# Patient Record
Sex: Female | Born: 2000 | Race: Black or African American | Hispanic: No | Marital: Single | Smoking: Never smoker
Health system: Southern US, Community
[De-identification: ages and names within clinical notes are randomized; demographics above are authoritative.]

## PROBLEM LIST (undated history)

## (undated) DIAGNOSIS — F419 Anxiety disorder, unspecified: Secondary | ICD-10-CM

## (undated) DIAGNOSIS — T7840XA Allergy, unspecified, initial encounter: Secondary | ICD-10-CM

## (undated) DIAGNOSIS — E119 Type 2 diabetes mellitus without complications: Secondary | ICD-10-CM

## (undated) HISTORY — DX: Allergy, unspecified, initial encounter: T78.40XA

## (undated) HISTORY — DX: Type 2 diabetes mellitus without complications: E11.9

## (undated) HISTORY — DX: Anxiety disorder, unspecified: F41.9

---
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
## 2022-05-05 ENCOUNTER — Encounter: Payer: Self-pay | Admitting: Family

## 2022-05-14 ENCOUNTER — Telehealth: Admitting: Family

## 2022-05-18 ENCOUNTER — Ambulatory Visit (INDEPENDENT_AMBULATORY_CARE_PROVIDER_SITE_OTHER): Admitting: Family

## 2022-05-18 ENCOUNTER — Encounter: Payer: Self-pay | Admitting: Family

## 2022-05-18 VITALS — BP 128/87 | HR 100 | Ht 67.32 in | Wt 278.8 lb

## 2022-05-18 DIAGNOSIS — E282 Polycystic ovarian syndrome: Secondary | ICD-10-CM

## 2022-05-18 DIAGNOSIS — Z3041 Encounter for surveillance of contraceptive pills: Secondary | ICD-10-CM

## 2022-05-18 DIAGNOSIS — L83 Acanthosis nigricans: Secondary | ICD-10-CM | POA: Diagnosis not present

## 2022-05-18 DIAGNOSIS — N926 Irregular menstruation, unspecified: Secondary | ICD-10-CM | POA: Diagnosis not present

## 2022-05-18 NOTE — Progress Notes (Addendum)
History was provided by the patient and mother.  Catherine Richmond is a 21 y.o. female who is here for PCOS.   PCP confirmed? Yes.    Bernadette Hoit, MD  HPI:  -no bleeding or period  -no cramping  -losing weight: working out, walking, eating twice daily  -never dizzy or light-headed  -water intake: about 60 oz per day  -no headaches, no vision changes - Rx current  -taking Metformin with no GI issues  -no acne, no hirsutism     Active Problem List:  Allergic rhinitis Acanthosis  Acne  Dysmenorrhea Morbid Obesity  Oligomenorrhea  Vitamin D Deficiency  Chronic allergic conjunctivitis Food Allergy   Current Outpatient Medications on File Prior to Visit  Medication Sig Dispense Refill   albuterol (VENTOLIN HFA) 108 (90 Base) MCG/ACT inhaler      azelastine (OPTIVAR) 0.05 % ophthalmic solution      chlorhexidine (PERIDEX) 0.12 % solution      Cholecalciferol (VITAMIN D) 2000 units tablet Take 2,000 Units by mouth daily.     clindamycin (CLINDAGEL) 1 % gel Apply topically 2 (two) times daily. To affected areas 30 g 3   drospirenone-ethinyl estradiol (YAZ) 3-0.02 MG tablet      EPINEPHrine 0.3 mg/0.3 mL IJ SOAJ injection USE AS DIRECTED AS NEEDED SYSTEMIC REACTIONS INJECTION 30 DAYS     fluticasone (FLONASE) 50 MCG/ACT nasal spray      levocetirizine (XYZAL) 5 MG tablet   3   metFORMIN (GLUCOPHAGE-XR) 750 MG 24 hr tablet Take 1 tablet (750 mg total) by mouth daily with breakfast. 90 tablet 3   montelukast (SINGULAIR) 10 MG tablet   3   Multiple Vitamin (MULTIVITAMIN) tablet Take 1 tablet by mouth daily.     norethindrone (AYGESTIN) 5 MG tablet Take 2 tablets (10 mg total) by mouth daily. 10 tablet 0   norgestrel-ethinyl estradiol (LO/OVRAL) 0.3-30 MG-MCG tablet Take 1 pill by mouth daily. (Patient not taking: Reported on 05/18/2022) 84 tablet 3   No current facility-administered medications on file prior to visit.    Allergies  Allergen Reactions   Dust Mite Extract     Gramineae Pollens    Other     Tree nuts. Pt reports her throat felt like it was closing after ingesting tree nuts on 3-4 different occasions.    Tree Extract     Physical Exam:    Vitals:   05/18/22 1138 05/18/22 1142  BP: 137/82 128/87  Pulse: (!) 107 100  Weight: 278 lb 12.8 oz (126.5 kg)   Height: 5' 7.32" (1.71 m)    Wt Readings from Last 3 Encounters:  05/18/22 278 lb 12.8 oz (126.5 kg) (>99 %, Z= 2.68)*  11/05/21 264 lb 9.6 oz (120 kg) (>99 %, Z= 2.56)*  06/25/21 273 lb 3.2 oz (123.9 kg) (>99 %, Z= 2.59)*   * Growth percentiles are based on CDC (Girls, 2-20 Years) data.     Blood pressure %iles are not available for patients who are 18 years or older. No LMP recorded.  Physical Exam Constitutional:      General: She is not in acute distress.    Appearance: She is well-developed.  HENT:     Head: Normocephalic and atraumatic.  Eyes:     General: No scleral icterus.    Pupils: Pupils are equal, round, and reactive to light.  Neck:     Thyroid: No thyromegaly.  Cardiovascular:     Rate and Rhythm: Normal rate and regular rhythm.  Heart sounds: Normal heart sounds. No murmur heard. Pulmonary:     Effort: Pulmonary effort is normal.     Breath sounds: Normal breath sounds.  Abdominal:     Palpations: Abdomen is soft.  Musculoskeletal:        General: Normal range of motion.     Cervical back: Normal range of motion and neck supple.  Lymphadenopathy:     Cervical: No cervical adenopathy.  Skin:    General: Skin is warm and dry.     Findings: No rash.  Neurological:     Mental Status: She is alert and oriented to person, place, and time.     Cranial Nerves: No cranial nerve deficit.  Psychiatric:        Behavior: Behavior normal.        Thought Content: Thought content normal.        Judgment: Judgment normal.    Assessment/Plan:  Irregular periods Acanthosis  Encounter for birth control pills maintenance History of insulin resistance and irregular  periods:  [name] had an elevated A1C of 5.8 in June 2015 and was seen by Pediatric Endocrinology at that time. Through dietary changes and lifestyle changes, by next follow-up in March 2016, her A1C had returned to normal range. [name] was referred to Adolescent Medicine in 2018 for irregular cycles, with acne well-managed by Epiduo topical gel. At that time, she was started on birth control pills to help regulate her cycles. Her blood work showed a normal testosterone level and an expected LH/FSH ratio, as well as a normal DHEAS. She continues on birth control pills to manage her cycle, has maintained clear skin through her acne medication regimen, and her A1C is within normal range as she continues to manage this through exercise, diet, and Metformin 750 mg daily.    PCOS is a metabolic condition with symptoms of menstrual irregularity, excess hair growth, acne and sometimes obesity. Treatment is symptom management, most importantly regulation of hormones through hormonal contraceptive options.Zenith's lab work did not support the diagnosis of PCOS, however we are managing symptoms similar to those seen in PCOS.  [name] is currently managing symptoms through continuous cycling birth control pills, and clindamycin gel for acne, which to date has completely cleared. She is not experiencing hirsutism, or excessive hair growth.    Monitoring labs today:  - CBC with Differential/Platelet - Comprehensive metabolic panel - Hemoglobin A1c - VITAMIN D 25 Hydroxy (Vit-D Deficiency, Fractures) - Lipid panel

## 2022-05-19 LAB — COMPREHENSIVE METABOLIC PANEL
AG Ratio: 1.1 (calc) (ref 1.0–2.5)
ALT: 7 U/L (ref 5–32)
AST: 13 U/L (ref 12–32)
Albumin: 3.6 g/dL (ref 3.6–5.1)
Alkaline phosphatase (APISO): 80 U/L (ref 36–128)
BUN: 9 mg/dL (ref 7–20)
CO2: 22 mmol/L (ref 20–32)
Calcium: 9 mg/dL (ref 8.9–10.4)
Chloride: 105 mmol/L (ref 98–110)
Creat: 0.92 mg/dL (ref 0.50–0.96)
Globulin: 3.2 g/dL (calc) (ref 2.0–3.8)
Glucose, Bld: 109 mg/dL (ref 65–139)
Potassium: 4 mmol/L (ref 3.8–5.1)
Sodium: 137 mmol/L (ref 135–146)
Total Bilirubin: 0.5 mg/dL (ref 0.2–1.1)
Total Protein: 6.8 g/dL (ref 6.3–8.2)

## 2022-05-19 LAB — LIPID PANEL
Cholesterol: 127 mg/dL (ref <170)
HDL: 53 mg/dL (ref >45)
LDL Cholesterol (Calc): 61 mg/dL (calc) (ref <110)
Non-HDL Cholesterol (Calc): 74 mg/dL (calc) (ref <120)
Total CHOL/HDL Ratio: 2.4 (calc) (ref <5.0)
Triglycerides: 58 mg/dL (ref <90)

## 2022-05-19 LAB — CBC WITH DIFFERENTIAL/PLATELET
Absolute Monocytes: 387 cells/uL (ref 200–950)
Basophils Absolute: 44 cells/uL (ref 0–200)
Basophils Relative: 0.5 %
Eosinophils Absolute: 18 cells/uL (ref 15–500)
Eosinophils Relative: 0.2 %
HCT: 39.2 % (ref 35.0–45.0)
Hemoglobin: 12.4 g/dL (ref 11.7–15.5)
Lymphs Abs: 1822 cells/uL (ref 850–3900)
MCH: 24.9 pg — ABNORMAL LOW (ref 27.0–33.0)
MCHC: 31.6 g/dL — ABNORMAL LOW (ref 32.0–36.0)
MCV: 78.7 fL — ABNORMAL LOW (ref 80.0–100.0)
MPV: 12 fL (ref 7.5–12.5)
Monocytes Relative: 4.4 %
Neutro Abs: 6530 cells/uL (ref 1500–7800)
Neutrophils Relative %: 74.2 %
Platelets: 396 10*3/uL (ref 140–400)
RBC: 4.98 10*6/uL (ref 3.80–5.10)
RDW: 14.5 % (ref 11.0–15.0)
Total Lymphocyte: 20.7 %
WBC: 8.8 10*3/uL (ref 3.8–10.8)

## 2022-05-19 LAB — HEMOGLOBIN A1C
Hgb A1c MFr Bld: 5.5 % of total Hgb (ref <5.7)
Mean Plasma Glucose: 111 mg/dL
eAG (mmol/L): 6.2 mmol/L

## 2022-05-19 LAB — VITAMIN D 25 HYDROXY (VIT D DEFICIENCY, FRACTURES): Vit D, 25-Hydroxy: 49 ng/mL (ref 30–100)

## 2022-05-21 ENCOUNTER — Encounter: Payer: Self-pay | Admitting: Family

## 2022-05-24 ENCOUNTER — Encounter: Payer: Self-pay | Admitting: Family

## 2022-05-24 MED ORDER — NORGESTREL-ETHINYL ESTRADIOL 0.3-30 MG-MCG PO TABS: ORAL_TABLET | ORAL | 3 refills | Status: DC

## 2022-05-25 ENCOUNTER — Encounter: Payer: Self-pay | Admitting: Family

## 2022-06-03 ENCOUNTER — Other Ambulatory Visit: Payer: Self-pay | Admitting: Pediatrics

## 2022-06-03 ENCOUNTER — Encounter: Payer: Self-pay | Admitting: Family

## 2022-06-03 DIAGNOSIS — N914 Secondary oligomenorrhea: Secondary | ICD-10-CM

## 2022-06-08 ENCOUNTER — Encounter: Payer: Self-pay | Admitting: Family

## 2022-06-14 ENCOUNTER — Other Ambulatory Visit: Payer: Self-pay | Admitting: Family

## 2022-06-15 ENCOUNTER — Encounter: Payer: Self-pay | Admitting: Family

## 2022-06-21 ENCOUNTER — Encounter (HOSPITAL_COMMUNITY): Payer: Self-pay

## 2022-06-21 ENCOUNTER — Emergency Department (HOSPITAL_COMMUNITY)
Admission: EM | Admit: 2022-06-21 | Discharge: 2022-06-21 | Disposition: A | Discharge status: Home / Self Care | Attending: Emergency Medicine | Admitting: Emergency Medicine

## 2022-06-21 ENCOUNTER — Other Ambulatory Visit: Payer: Self-pay

## 2022-06-21 DIAGNOSIS — R197 Diarrhea, unspecified: Secondary | ICD-10-CM | POA: Diagnosis not present

## 2022-06-21 DIAGNOSIS — E119 Type 2 diabetes mellitus without complications: Secondary | ICD-10-CM | POA: Insufficient documentation

## 2022-06-21 DIAGNOSIS — R1013 Epigastric pain: Secondary | ICD-10-CM | POA: Insufficient documentation

## 2022-06-21 LAB — CBC WITH DIFFERENTIAL/PLATELET
Abs Immature Granulocytes: 0.04 10*3/uL (ref 0.00–0.07)
Basophils Absolute: 0 10*3/uL (ref 0.0–0.1)
Basophils Relative: 0 %
Eosinophils Absolute: 0.1 10*3/uL (ref 0.0–0.5)
Eosinophils Relative: 1 %
HCT: 38.3 % (ref 36.0–46.0)
Hemoglobin: 12.1 g/dL (ref 12.0–15.0)
Immature Granulocytes: 0 %
Lymphocytes Relative: 22 %
Lymphs Abs: 2.7 10*3/uL (ref 0.7–4.0)
MCH: 25.1 pg — ABNORMAL LOW (ref 26.0–34.0)
MCHC: 31.6 g/dL (ref 30.0–36.0)
MCV: 79.3 fL — ABNORMAL LOW (ref 80.0–100.0)
Monocytes Absolute: 0.8 10*3/uL (ref 0.1–1.0)
Monocytes Relative: 6 %
Neutro Abs: 8.6 10*3/uL — ABNORMAL HIGH (ref 1.7–7.7)
Neutrophils Relative %: 71 %
Platelets: 402 10*3/uL — ABNORMAL HIGH (ref 150–400)
RBC: 4.83 MIL/uL (ref 3.87–5.11)
RDW: 14.4 % (ref 11.5–15.5)
WBC: 12.2 10*3/uL — ABNORMAL HIGH (ref 4.0–10.5)
nRBC: 0 % (ref 0.0–0.2)

## 2022-06-21 LAB — COMPREHENSIVE METABOLIC PANEL
ALT: 12 U/L (ref 0–44)
AST: 17 U/L (ref 15–41)
Albumin: 3.4 g/dL — ABNORMAL LOW (ref 3.5–5.0)
Alkaline Phosphatase: 66 U/L (ref 38–126)
Anion gap: 6 (ref 5–15)
BUN: 11 mg/dL (ref 6–20)
CO2: 24 mmol/L (ref 22–32)
Calcium: 8.7 mg/dL — ABNORMAL LOW (ref 8.9–10.3)
Chloride: 107 mmol/L (ref 98–111)
Creatinine, Ser: 0.86 mg/dL (ref 0.44–1.00)
GFR, Estimated: >60 mL/min (ref >60)
Glucose, Bld: 82 mg/dL (ref 70–99)
Potassium: 3.7 mmol/L (ref 3.5–5.1)
Sodium: 137 mmol/L (ref 135–145)
Total Bilirubin: 0.4 mg/dL (ref 0.3–1.2)
Total Protein: 7.4 g/dL (ref 6.5–8.1)

## 2022-06-21 LAB — LIPASE, BLOOD: Lipase: 31 U/L (ref 11–51)

## 2022-06-21 MED ORDER — ACETAMINOPHEN 325 MG PO TABS
650.0000 mg | ORAL_TABLET | Freq: Once | ORAL | Status: AC
  Administered 2022-06-21: 650 mg via ORAL
  Filled 2022-06-21: qty 2

## 2022-06-21 MED ORDER — SUCRALFATE 1 G PO TABS: 1.0000 g | ORAL_TABLET | Freq: Four times a day (QID) | ORAL | 0 refills | Status: AC | PRN

## 2022-06-21 MED ORDER — OMEPRAZOLE 20 MG PO CPDR: 20.0000 mg | DELAYED_RELEASE_CAPSULE | Freq: Every day | ORAL | 1 refills | Status: AC

## 2022-06-21 MED ORDER — ALUM & MAG HYDROXIDE-SIMETH 200-200-20 MG/5ML PO SUSP
30.0000 mL | Freq: Once | ORAL | Status: AC
  Administered 2022-06-21: 30 mL via ORAL
  Filled 2022-06-21: qty 30

## 2022-06-21 MED ORDER — SUCRALFATE 1 G PO TABS: 1.0000 g | ORAL_TABLET | Freq: Four times a day (QID) | ORAL | 0 refills | Status: DC | PRN

## 2022-06-21 MED ORDER — OMEPRAZOLE 20 MG PO CPDR: 20.0000 mg | DELAYED_RELEASE_CAPSULE | Freq: Every day | ORAL | 1 refills | Status: DC

## 2022-06-21 NOTE — Discharge Instructions (Addendum)
You were evaluated in the Emergency Department and after careful evaluation, we did not find any emergent condition requiring admission or further testing in the hospital.  Your exam/testing today is overall reassuring.  Symptoms likely due to gastritis or inflammation of the lining of the stomach.  This can be due to a virus or due to gastroesophageal reflux disease.  Take the omeprazole and Carafate as we discussed.  Please return to the Emergency Department if you experience any worsening of your condition.   Thank you for allowing [place] to be a part of your care.

## 2022-06-21 NOTE — ED Provider Notes (Signed)
[facility]-EMERGENCY DEPT [facility] Emergency Department Provider Note MRN:  [id number]  Arrival date & time: 06/21/22     Chief Complaint   Abdominal Pain   History of Present Illness   Catherine Richmond is a 21 y.o. year-old female with no prior baseline history presenting to the ED with chief complaint of abdominal pain.  Epigastric abdominal pain for the past week.  Usually only occurring at night when lying flat.  Today has been present all day.  Described as burning/tightness.  No shortness of breath, no leg pain or swelling, no chest pain.  No fever.  Episode of diarrhea earlier this week.  Review of Systems  A thorough review of systems was obtained and all systems are negative except as noted in the HPI and PMH.   Patient's Health History    Past Medical History:  Diagnosis Date   Allergy    Phreesia 12/22/2020   Anxiety    Phreesia 12/22/2020   Constipation    Diabetes mellitus without complication (HCC)    Phreesia 12/22/2020    History reviewed. No pertinent surgical history.  Family History  Problem Relation Age of Onset   Hypertension Mother    Hypertension Father    Hyperlipidemia Father    Hyperlipidemia Maternal Grandmother    Hypertension Maternal Grandmother    Hyperlipidemia Maternal Grandfather    Hypertension Maternal Grandfather    Diabetes Maternal Grandfather     Social History   Socioeconomic History   Marital status: Single    Spouse name: Not on file   Number of children: Not on file   Years of education: Not on file   Highest education level: Not on file  Occupational History   Not on file  Tobacco Use   Smoking status: Never   Smokeless tobacco: Never  Substance and Sexual Activity   Alcohol use: No    Alcohol/week: 0.0 standard drinks of alcohol   Drug use: No   Sexual activity: Not on file  Other Topics Concern   Not on file  Social History Narrative   Is in 6th grade at [employer]   Lives with parents and siblings    Social Determinants of Health   Financial Resource Strain: Not on file  Food Insecurity: Not on file  Transportation Needs: Not on file  Physical Activity: Not on file  Stress: Not on file  Social Connections: Not on file  Intimate Partner Violence: Not on file     Physical Exam   Vitals:   06/21/22 2148 06/21/22 2316  BP: 130/75 127/83  Pulse: 78 88  Resp: 17 18  Temp:  98.7 F (37.1 C)  SpO2: 100% 100%    CONSTITUTIONAL: Well-appearing, NAD NEURO/PSYCH:  Alert and oriented x 3, no focal deficits EYES:  eyes equal and reactive ENT/NECK:  no LAD, no JVD CARDIO: Regular rate, well-perfused, normal S1 and S2 PULM:  CTAB no wheezing or rhonchi GI/GU:  non-distended, non-tender MSK/SPINE:  No gross deformities, no edema SKIN:  no rash, atraumatic   *Additional and/or pertinent findings included in MDM below  Diagnostic and Interventional Summary    EKG Interpretation  Date/Time:    Ventricular Rate:    PR Interval:    QRS Duration:   QT Interval:    QTC Calculation:   R Axis:     Text Interpretation:         Labs Reviewed  COMPREHENSIVE METABOLIC PANEL - Abnormal; Notable for the following components:  Result Value   Calcium 8.7 (*)    Albumin 3.4 (*)    All other components within normal limits  CBC WITH DIFFERENTIAL/PLATELET - Abnormal; Notable for the following components:   WBC 12.2 (*)    MCV 79.3 (*)    MCH 25.1 (*)    Platelets 402 (*)    Neutro Abs 8.6 (*)    All other components within normal limits  LIPASE, BLOOD    No orders to display    Medications  acetaminophen (TYLENOL) tablet 650 mg (650 mg Oral Given 06/21/22 1827)  alum & mag hydroxide-simeth (MAALOX/MYLANTA) 200-200-20 MG/5ML suspension 30 mL (30 mLs Oral Given 06/21/22 2343)     Procedures  /  Critical Care Procedures  ED Course and Medical Decision Making  Initial Impression and Ddx History suspicious for GERD.  Gallstones, biliary colic felt to be much less likely,  no right upper quadrant abdominal tenderness.  Screening labs show normal LFTs, bilirubin, normal lipase, normal blood counts and electrolytes.  Nothing to suggest emergent process, appropriate for trial of PPI  Past medical/surgical history that increases complexity of ED encounter: None  Interpretation of Diagnostics I personally reviewed the laboratory assessment and my interpretation is as follows: See above    Patient Reassessment and Ultimate Disposition/Management     Discharge  Patient management required discussion with the following services or consulting groups:  None  Complexity of Problems Addressed Acute illness or injury that poses threat of life of bodily function  Additional Data Reviewed and Analyzed Further history obtained from: Further history from spouse/family member  Additional Factors Impacting ED Encounter Risk Prescriptions  Elmer Sow. Pilar Plate, MD [facility] Emergency Medicine [facility] [email or web address]  Final Clinical Impressions(s) / ED Diagnoses     ICD-10-CM   1. Epigastric pain  R10.13       ED Discharge Orders          Ordered    omeprazole (PRILOSEC) 20 MG capsule  Daily        06/21/22 2344    sucralfate (CARAFATE) 1 g tablet  4 times daily PRN        06/21/22 2344             Discharge Instructions Discussed with and Provided to Patient:    Discharge Instructions      You were evaluated in the Emergency Department and after careful evaluation, we did not find any emergent condition requiring admission or further testing in the hospital.  Your exam/testing today is overall reassuring.  Symptoms likely due to gastritis or inflammation of the lining of the stomach.  This can be due to a virus or due to gastroesophageal reflux disease.  Take the omeprazole and Carafate as we discussed.  Please return to the Emergency Department if you experience any worsening of your condition.   Thank you for allowing [place]  to be a part of your care.      Sabas Sous, MD 06/21/22 [phone]

## 2022-06-21 NOTE — ED Triage Notes (Signed)
Pt reports lower mid abdominal pain for a week and a half. Pt denies N/V and endorses one episode of diarrhea. Denies fever, back pain, abnormal vaginal discharge, and vaginal bleeding. LMP in April, pt reports she takes birth control.

## 2022-06-21 NOTE — ED Provider Triage Note (Signed)
Emergency Medicine Provider Triage Evaluation Note  Catherine Richmond , a 21 y.o. female  was evaluated in triage.  Pt complains of abdominal pain in a bandlike pattern across the epigastrium x1 week.  Patient denies nausea, vomiting, diarrhea.  She does note that she is on OCP and has not had a period since April.  She denies dysuria, hematuria, vaginal discharge or discomfort..  Review of Systems  Positive:  Negative:   Physical Exam  BP 134/86 (BP Location: Left Arm)   Pulse (!) 105   Temp 98.6 F (37 C) (Oral)   Resp 18   SpO2 100%  Gen:   Awake, no distress   Resp:  Normal effort  MSK:   Moves extremities without difficulty  Other:  Abdomen is soft, nondistended and not obviously tender to palpation.  When pressing across the upper abdomen, she just notes that it is "uncomfortable"  Medical Decision Making  Medically screening exam initiated at 5:35 PM.  Appropriate orders placed.  Salome Holmes was informed that the remainder of the evaluation will be completed by another provider, this initial triage assessment does not replace that evaluation, and the importance of remaining in the ED until their evaluation is complete.     Janell Quiet, [place] 06/21/22 1736

## 2022-06-22 ENCOUNTER — Encounter: Payer: Self-pay | Admitting: Family

## 2022-07-02 ENCOUNTER — Encounter: Payer: Self-pay | Admitting: Family

## 2022-07-12 ENCOUNTER — Encounter: Payer: Self-pay | Admitting: Family

## 2022-07-14 ENCOUNTER — Encounter: Payer: Self-pay | Admitting: Family

## 2022-07-26 ENCOUNTER — Ambulatory Visit: Admitting: Family

## 2022-08-06 ENCOUNTER — Telehealth: Payer: Self-pay | Admitting: Family

## 2022-08-06 ENCOUNTER — Encounter: Payer: Self-pay | Admitting: Family

## 2022-08-06 ENCOUNTER — Ambulatory Visit (INDEPENDENT_AMBULATORY_CARE_PROVIDER_SITE_OTHER): Admitting: Family

## 2022-08-06 VITALS — BP 139/86 | HR 83 | Ht 67.0 in | Wt 287.8 lb

## 2022-08-06 DIAGNOSIS — N9489 Other specified conditions associated with female genital organs and menstrual cycle: Secondary | ICD-10-CM

## 2022-08-06 NOTE — Telephone Encounter (Signed)
Erroneous encounter

## 2022-08-06 NOTE — Progress Notes (Signed)
History was provided by the patient and mother.  Catherine Richmond is a 21 y.o. female who is here for physical, paperwork for ROTC.   PCP confirmed? Needs adult PCP   HPI:   Here for from from [facility] for clearance, needs body metrics form filled out LMP 8/23-9/1, otherwise using pills for menstrual suppresion  Had COVID 08/03 without any residual symptoms or side effects She has never had an EKG/WCHO for baseline No headaches, no vision changes No chest pain, SOB, no pain or concerns with exertion  GERD symptoms only with certain foods  No nausea, vomiting, diarrhea or constipation  No dysuria Mild dry skin  on L chest/shoulder; no itching, no rash No Muscle pain or joint pain    Patient Active Problem List   Diagnosis Date Noted   Allergic rhinitis 07/31/2021   Allergic rhinitis due to animal (cat) (dog) hair and dander 07/31/2021   Allergic rhinitis due to pollen 07/31/2021   Chronic allergic conjunctivitis 07/31/2021   Food allergy 07/31/2021   Diagnosis deferred 11/05/2019   Dysmenorrhea 04/18/2019   Vitamin D deficiency 03/05/2019   Oligomenorrhea 03/11/2016   Acne 03/11/2016   Morbid obesity (HCC) 02/24/2015   Acanthosis 10/17/2014    Current Outpatient Medications on File Prior to Visit  Medication Sig Dispense Refill   azelastine (OPTIVAR) 0.05 % ophthalmic solution      chlorhexidine (PERIDEX) 0.12 % solution      Cholecalciferol (VITAMIN D) 2000 units tablet Take 2,000 Units by mouth daily.     EPINEPHrine 0.3 mg/0.3 mL IJ SOAJ injection USE AS DIRECTED AS NEEDED SYSTEMIC REACTIONS INJECTION 30 DAYS     fluticasone (FLONASE) 50 MCG/ACT nasal spray      metFORMIN (GLUCOPHAGE-XR) 750 MG 24 hr tablet Take 1 tablet (750 mg total) by mouth daily with breakfast. 90 tablet 3   montelukast (SINGULAIR) 10 MG tablet   3   Multiple Vitamin (MULTIVITAMIN) tablet Take 1 tablet by mouth daily.     norgestrel-ethinyl estradiol (CRYSELLE-28) 0.3-30 MG-MCG tablet TAKE 1 TABLET  DAILY, CONTINUOUS CYCLING 112 tablet 3   omeprazole (PRILOSEC) 20 MG capsule Take 1 capsule (20 mg total) by mouth daily. 30 capsule 1   sucralfate (CARAFATE) 1 g tablet Take 1 tablet (1 g total) by mouth 4 (four) times daily as needed. 30 tablet 0   No current facility-administered medications on file prior to visit.    Allergies  Allergen Reactions   Dust Mite Extract    Gramineae Pollens    Other     Tree nuts. Pt reports her throat felt like it was closing after ingesting tree nuts on 3-4 different occasions.    Tree Extract     Physical Exam:    Vitals:   08/06/22 1035 08/06/22 1036  BP: 136/83 139/86  Pulse: 88 83  Weight: 287 lb 12.8 oz (130.5 kg)   Height: 5\' 7"  (1.702 m)    Wt Readings from Last 3 Encounters:  08/06/22 287 lb 12.8 oz (130.5 kg) (>99 %, Z= 2.75)*  05/18/22 278 lb 12.8 oz (126.5 kg) (>99 %, Z= 2.68)*  11/05/21 264 lb 9.6 oz (120 kg) (>99 %, Z= 2.56)*   * Growth percentiles are based on CDC (Girls, 2-20 Years) data.     Blood pressure %iles are not available for patients who are 18 years or older. No LMP recorded.  Physical Exam Constitutional:      General: She is not in acute distress.    Appearance: She  is well-developed.  HENT:     Head: Normocephalic and atraumatic.  Eyes:     General: No scleral icterus.    Pupils: Pupils are equal, round, and reactive to light.  Neck:     Thyroid: No thyromegaly.  Cardiovascular:     Rate and Rhythm: Normal rate and regular rhythm.     Heart sounds: Normal heart sounds. No murmur heard. Pulmonary:     Effort: Pulmonary effort is normal.     Breath sounds: Normal breath sounds.  Musculoskeletal:        General: No swelling. Normal range of motion.     Cervical back: Normal range of motion and neck supple.  Lymphadenopathy:     Cervical: No cervical adenopathy.  Skin:    General: Skin is warm and dry.     Capillary Refill: Capillary refill takes less than 2 seconds.     Findings: No rash.   Neurological:     General: No focal deficit present.     Mental Status: She is alert and oriented to person, place, and time.     Cranial Nerves: No cranial nerve deficit.     Motor: No weakness or tremor.     Gait: Gait normal.  Psychiatric:        Attention and Perception: Attention normal.        Mood and Affect: Mood normal.        Speech: Speech normal.        Behavior: Behavior normal.        Thought Content: Thought content normal.        Judgment: Judgment normal.     Assessment/Plan: 1. Menstrual suppression 2. Morbid obesity (HCC)  We reviewed ROTC form together; filled out and given to patient.  Explained in form that she has not had any physical complications with physical activity or exertion. Her A1C has been in normal range for over 4 years we have monitored.  Initially in 2017, she had an A1C of 5.8 with acanthosis noted and irregular periods, however the diagnosis of PCOS was not supported. At that time, she also had a low vitamin D level, which has since resolved. We continue routine monitoring of these to ensure they remain well-controlled/within normal limits.  Trixie has not had an elevated A1C since I have been monitoring her and her vitamin D levels have remained in the normal range since 2020.  She continues to take Metformin and her periods are regulated with COC birth control pills, which she takes for continuous cycling.  As such, I have updated her medical record today and have removed the diagnoses PCOS, prediabetes, Vitamin D deficiency, and "Diabetes Mellitis, without complications" from her problem lists, and I have marked acne and acanthosis as resolved problems. I have updated the problem list to include menstrual suppression.  We discussed baseline EKG due to family history, which she will consider in future.

## 2022-08-27 ENCOUNTER — Encounter: Payer: Self-pay | Admitting: Family

## 2022-08-31 ENCOUNTER — Encounter: Payer: Self-pay | Admitting: Family

## 2022-09-21 ENCOUNTER — Encounter: Payer: Self-pay | Admitting: Family

## 2022-09-21 ENCOUNTER — Ambulatory Visit (INDEPENDENT_AMBULATORY_CARE_PROVIDER_SITE_OTHER): Admitting: Family

## 2022-09-21 VITALS — BP 135/82 | HR 95 | Ht 67.42 in | Wt 277.8 lb

## 2022-09-21 DIAGNOSIS — H66001 Acute suppurative otitis media without spontaneous rupture of ear drum, right ear: Secondary | ICD-10-CM | POA: Diagnosis not present

## 2022-09-21 MED ORDER — AMOXICILLIN-POT CLAVULANATE 875-125 MG PO TABS: 1.0000 | ORAL_TABLET | Freq: Two times a day (BID) | ORAL | 0 refills | Status: AC

## 2022-09-21 NOTE — Progress Notes (Signed)
History was provided by the patient and mother.  Catherine Richmond is a 21 y.o. female who is here for acute issue of lymph node swelling.   PCP confirmed? No  HPI:   -swollen lymph node a few days  -no sick contacts  -allergies have been bad; not getting them every week due to school schedule  -taking singulair and claritin  -chills, no known fever   -having chills and side of neck on R side swollen; painful to touch -headaches x a couple days  -no cough  -some nasal congestion  -no colored secretions  -no ear pain  -no sore throat  -no stomach pain  -no nausea/vomiting  -a little upset stomach    -got flu shot; has to get newest covid shot but has to wait 4-5 days  -yesterday was last allergy shots   -tylenol with little benefit    Patient Active Problem List   Diagnosis Date Noted   Menstrual suppression 08/06/2022   Allergic rhinitis 07/31/2021   Allergic rhinitis due to animal (cat) (dog) hair and dander 07/31/2021   Allergic rhinitis due to pollen 07/31/2021   Chronic allergic conjunctivitis 07/31/2021   Food allergy 07/31/2021   Morbid obesity (HCC) 02/24/2015    Current Outpatient Medications on File Prior to Visit  Medication Sig Dispense Refill   azelastine (OPTIVAR) 0.05 % ophthalmic solution      chlorhexidine (PERIDEX) 0.12 % solution      Cholecalciferol (VITAMIN D) 2000 units tablet Take 2,000 Units by mouth daily.     EPINEPHrine 0.3 mg/0.3 mL IJ SOAJ injection USE AS DIRECTED AS NEEDED SYSTEMIC REACTIONS INJECTION 30 DAYS     fluticasone (FLONASE) 50 MCG/ACT nasal spray      metFORMIN (GLUCOPHAGE-XR) 750 MG 24 hr tablet Take 1 tablet (750 mg total) by mouth daily with breakfast. 90 tablet 3   montelukast (SINGULAIR) 10 MG tablet   3   Multiple Vitamin (MULTIVITAMIN) tablet Take 1 tablet by mouth daily.     norgestrel-ethinyl estradiol (CRYSELLE-28) 0.3-30 MG-MCG tablet TAKE 1 TABLET DAILY, CONTINUOUS CYCLING 112 tablet 3   omeprazole (PRILOSEC) 20  MG capsule Take 1 capsule (20 mg total) by mouth daily. 30 capsule 1   sucralfate (CARAFATE) 1 g tablet Take 1 tablet (1 g total) by mouth 4 (four) times daily as needed. 30 tablet 0   No current facility-administered medications on file prior to visit.    Allergies  Allergen Reactions   Dust Mite Extract    Gramineae Pollens    Other     Tree nuts. Pt reports her throat felt like it was closing after ingesting tree nuts on 3-4 different occasions.    Tree Extract     Physical Exam:    Vitals:   09/21/22 1036  BP: 135/82  Pulse: 95  Weight: 277 lb 12.8 oz (126 kg)  Height: 5' 7.42" (1.712 m)    Blood pressure %iles are not available for patients who are 18 years or older. No LMP recorded.  Physical Exam Constitutional:      General: She is not in acute distress.    Appearance: She is well-developed.  HENT:     Head: Normocephalic and atraumatic.     Right Ear: External ear normal. Swelling present. Tympanic membrane is injected, erythematous and bulging.     Left Ear: No swelling. Tympanic membrane is not injected, erythematous or bulging.  Eyes:     General: No scleral icterus.    Pupils: Pupils  are equal, round, and reactive to light.  Neck:     Thyroid: No thyromegaly.  Cardiovascular:     Rate and Rhythm: Normal rate.     Heart sounds: Normal heart sounds. No murmur heard. Pulmonary:     Effort: Pulmonary effort is normal.     Breath sounds: Normal breath sounds.  Musculoskeletal:        General: Normal range of motion.     Cervical back: Normal range of motion and neck supple.  Lymphadenopathy:     Cervical: No cervical adenopathy.  Skin:    General: Skin is warm and dry.     Findings: No rash.  Neurological:     Mental Status: She is alert and oriented to person, place, and time.     Cranial Nerves: No cranial nerve deficit.  Psychiatric:        Behavior: Behavior normal.        Thought Content: Thought content normal.        Judgment: Judgment  normal.      Assessment/Plan: 1. Acute suppurative otitis media of right ear without spontaneous rupture of tympanic membrane, recurrence not specified -abx for infection; return precautions reviewed  -tylenol and ibuprofen as needed for pain/fever  -rest and fluids  - amoxicillin-clavulanate (AUGMENTIN) 875-125 MG tablet; Take 1 tablet by mouth 2 (two) times daily for 7 days.  Dispense: 14 tablet; Refill: 0

## 2022-09-23 ENCOUNTER — Encounter: Payer: Self-pay | Admitting: Family

## 2022-09-24 ENCOUNTER — Ambulatory Visit: Admitting: Family

## 2022-09-28 ENCOUNTER — Ambulatory Visit: Admitting: Family

## 2022-11-29 ENCOUNTER — Other Ambulatory Visit: Payer: Self-pay | Admitting: Family

## 2023-02-22 ENCOUNTER — Encounter: Payer: Self-pay | Admitting: Family

## 2023-02-23 ENCOUNTER — Other Ambulatory Visit: Payer: Self-pay | Admitting: Family

## 2023-02-23 MED ORDER — METFORMIN HCL ER 750 MG PO TB24: 750.0000 mg | ORAL_TABLET | Freq: Every day | ORAL | 3 refills | Status: AC

## 2023-03-01 ENCOUNTER — Encounter: Payer: Self-pay | Admitting: Family

## 2023-03-16 ENCOUNTER — Encounter: Payer: Self-pay | Admitting: Family

## 2023-03-16 ENCOUNTER — Ambulatory Visit: Admitting: Family

## 2023-03-16 DIAGNOSIS — E282 Polycystic ovarian syndrome: Secondary | ICD-10-CM

## 2023-03-16 NOTE — Progress Notes (Signed)
Patient not seen. Closed for admin purposes.  

## 2023-03-25 ENCOUNTER — Encounter: Payer: Self-pay | Admitting: Family

## 2023-11-28 ENCOUNTER — Ambulatory Visit (INDEPENDENT_AMBULATORY_CARE_PROVIDER_SITE_OTHER): Admitting: Podiatry

## 2023-11-28 ENCOUNTER — Ambulatory Visit (INDEPENDENT_AMBULATORY_CARE_PROVIDER_SITE_OTHER)

## 2023-11-28 DIAGNOSIS — M205X2 Other deformities of toe(s) (acquired), left foot: Secondary | ICD-10-CM | POA: Diagnosis not present

## 2023-11-28 DIAGNOSIS — M778 Other enthesopathies, not elsewhere classified: Secondary | ICD-10-CM

## 2023-11-28 DIAGNOSIS — L600 Ingrowing nail: Secondary | ICD-10-CM

## 2023-11-28 NOTE — Patient Instructions (Signed)
 Ingrown Toenail  An ingrown toenail occurs when the corner or sides of a toenail grow into the surrounding skin. This causes discomfort and pain. The big toe is most commonly affected, but any of the toes can be affected. If an ingrown toenail is not treated, it can become infected. What are the causes? This condition may be caused by: Wearing shoes that are too small or tight. An injury, such as stubbing your toe or having your toe stepped on. Improper cutting or care of your toenails. Having nail or foot abnormalities that were present from birth (congenital abnormalities), such as having a nail that is too big for your toe. What increases the risk? The following factors may make you more likely to develop ingrown toenails: Age. Nails tend to get thicker with age, so ingrown nails are more common among older people. Cutting your toenails incorrectly, such as cutting them very short or cutting them unevenly. An ingrown toenail is more likely to get infected if you have: Diabetes. Blood flow (circulation) problems. What are the signs or symptoms? Symptoms of an ingrown toenail may include: Pain, soreness, or tenderness. Redness. Swelling. Hardening of the skin that surrounds the toenail. Signs that an ingrown toenail may be infected include: Fluid or pus. Symptoms that get worse. How is this diagnosed? Ingrown toenails may be diagnosed based on: Your symptoms and medical history. A physical exam. Labs or tests. If you have fluid or blood coming from your toenail, a sample may be collected to test for the specific type of bacteria that is causing the infection. How is this treated? Treatment depends on the severity of your symptoms. You may be able to care for your toenail at home. If you have an infection, you may be prescribed antibiotic medicines. If you have fluid or pus draining from your toenail, your health care provider may drain it. If you have trouble walking, you may be  given crutches to use. If you have a severe or infected ingrown toenail, you may need a procedure to remove part or all of the nail. Follow these instructions at home: Foot care  Check your wound every day for signs of infection, or as often as told by your health care provider. Check for: More redness, swelling, or pain. More fluid or blood. Warmth. Pus or a bad smell. Do not pick at your toenail or try to remove it yourself. Soak your foot in warm, soapy water. Do this for 20 minutes, 3 times a day, or as often as told by your health care provider. This helps to keep your toe clean and your skin soft. Wear shoes that fit well and are not too tight. Your health care provider may recommend that you wear open-toed shoes while you heal. Trim your toenails regularly and carefully. Cut your toenails straight across to prevent injury to the skin at the corners of the toenail. Do not cut your nails in a curved shape. Keep your feet clean and dry to help prevent infection. General instructions Take over-the-counter and prescription medicines only as told by your health care provider. If you were prescribed an antibiotic, take it as told by your health care provider. Do not stop taking the antibiotic even if you start to feel better. If your health care provider told you to use crutches to help you move around, use them as instructed. Return to your normal activities as told by your health care provider. Ask your health care provider what activities are safe for you.  Keep all follow-up visits. This is important. Contact a health care provider if: You have more redness, swelling, pain, or other symptoms that do not improve with treatment. You have fluid, blood, or pus coming from your toenail. You have a red streak on your skin that starts at your foot and spreads up your leg. You have a fever. Summary An ingrown toenail occurs when the corner or sides of a toenail grow into the surrounding skin.  This causes discomfort and pain. The big toe is most commonly affected, but any of the toes can be affected. If an ingrown toenail is not treated, it can become infected. Fluid or pus draining from your toenail is a sign of infection. Your health care provider may need to drain it. You may be given antibiotics to treat the infection. Trimming your toenails regularly and properly can help you prevent an ingrown toenail. This information is not intended to replace advice given to you by your health care provider. Make sure you discuss any questions you have with your health care provider. Document Revised: 03/17/2021 Document Reviewed: 03/17/2021 Elsevier Patient Education  2024 [employer].

## 2023-11-28 NOTE — Progress Notes (Signed)
Subjective:   Patient ID: Catherine Richmond, female   DOB: 22 y.o.   MRN: [id number]   HPI Chief Complaint  Patient presents with   Foot Pain    LT 1st hallux. Mom says there is intermittent redness and swelling. She did have an ingrown removed on that toe a couple of years ago. No pain at this time.    22 year old female presents the office with above concerns.  She states that she made the appointment she had some swelling and pain but this is since disappeared.  She points along the medial aspect of the toe as well as the toenail with significant discomfort.  Denies any drainage or pus.  Currently no swelling or redness.  No injuries that she reports.   Review of Systems  All other systems reviewed and are negative.  Past Medical History:  Diagnosis Date   Acanthosis 10/17/2014   Allergy    [employer] 12/22/2020   Anxiety    [employer] 12/22/2020    No past surgical history on file.   Current Outpatient Medications:    azelastine (OPTIVAR) 0.05 % ophthalmic solution, , Disp: , Rfl:    chlorhexidine (PERIDEX) 0.12 % solution, , Disp: , Rfl:    Cholecalciferol (VITAMIN D) 2000 units tablet, Take 2,000 Units by mouth daily., Disp: , Rfl:    EPINEPHrine 0.3 mg/0.3 mL IJ SOAJ injection, USE AS DIRECTED AS NEEDED SYSTEMIC REACTIONS INJECTION 30 DAYS, Disp: , Rfl:    fluticasone (FLONASE) 50 MCG/ACT nasal spray, , Disp: , Rfl:    metFORMIN (GLUCOPHAGE-XR) 750 MG 24 hr tablet, Take 1 tablet (750 mg total) by mouth daily with breakfast., Disp: 90 tablet, Rfl: 3   montelukast (SINGULAIR) 10 MG tablet, , Disp: , Rfl: 3   Multiple Vitamin (MULTIVITAMIN) tablet, Take 1 tablet by mouth daily., Disp: , Rfl:    norgestrel-ethinyl estradiol (CRYSELLE-28) 0.3-30 MG-MCG tablet, TAKE 1 TABLET DAILY, CONTINUOUS CYCLING, Disp: 112 tablet, Rfl: 3   omeprazole (PRILOSEC) 20 MG capsule, Take 1 capsule (20 mg total) by mouth daily., Disp: 30 capsule, Rfl: 1   sucralfate (CARAFATE) 1 g tablet, Take 1 tablet  (1 g total) by mouth 4 (four) times daily as needed., Disp: 30 tablet, Rfl: 0  Allergies  Allergen Reactions   Dust Mite Extract    Gramineae Pollens    Other     Tree nuts. Pt reports her throat felt like it was closing after ingesting tree nuts on 3-4 different occasions.    Tree Extract            Objective:  Physical Exam  General: AAO x3, NAD  Dermatological: Minimal incurvation present to the hallux toenail medially without any edema, erythema, drainage or pus or signs of infection.  No open lesions.  Vascular: Dorsalis Pedis artery and Posterior Tibial artery pedal pulses are 2/4 bilateral with immedate capillary fill time.  There is no pain with calf compression, swelling, warmth, erythema.   Neruologic: Grossly intact via light touch bilateral.  Musculoskeletal: Open with the evaluation she has a hallux extensors noted.  There is no edema, erythema or any areas of discomfort today.  Gait: Unassisted, Nonantalgic.       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  3 views of the foot were obtained.  No acute fracture.  Elevation of the first ray noted. -I think her symptoms are multifactorial coming from the hallux extensors, rubbing that she is also ingrown toenail.  We discussed shoes to avoid any excess pressure, wider toebox shoe.  Dispensed a toe cap.  We discussed partial nail avulsion.  She may consider this in the future but for now as it does not cause any issues and she is going back to college she is to hold off on this for now monitoring signs or symptoms of infection or reoccurrence.    Vivi Barrack DPM

## 2023-12-02 ENCOUNTER — Ambulatory Visit: Admitting: Podiatry

## 2024-02-21 ENCOUNTER — Telehealth: Payer: Self-pay | Admitting: Podiatry

## 2024-02-21 NOTE — Telephone Encounter (Signed)
 Note: will pay outstanding bill of $50.00 on February 28, 2024.

## 2024-03-09 ENCOUNTER — Other Ambulatory Visit: Payer: Self-pay | Admitting: Family
